# Patient Record
Sex: Female | Born: 1995
Health system: Southern US, Community
[De-identification: ages and names within clinical notes are randomized; demographics above are authoritative.]

---

## 2014-12-02 ENCOUNTER — Encounter (HOSPITAL_COMMUNITY): Payer: Self-pay | Admitting: *Deleted

## 2014-12-02 ENCOUNTER — Emergency Department (HOSPITAL_COMMUNITY)
Admission: EM | Admit: 2014-12-02 | Discharge: 2014-12-02 | Disposition: A | Discharge status: Home / Self Care | Attending: Emergency Medicine | Admitting: Emergency Medicine

## 2014-12-02 ENCOUNTER — Emergency Department (HOSPITAL_COMMUNITY)

## 2014-12-02 DIAGNOSIS — J069 Acute upper respiratory infection, unspecified: Secondary | ICD-10-CM | POA: Diagnosis not present

## 2014-12-02 DIAGNOSIS — Z79899 Other long term (current) drug therapy: Secondary | ICD-10-CM | POA: Insufficient documentation

## 2014-12-02 DIAGNOSIS — R52 Pain, unspecified: Secondary | ICD-10-CM | POA: Diagnosis present

## 2014-12-02 DIAGNOSIS — Z87891 Personal history of nicotine dependence: Secondary | ICD-10-CM | POA: Diagnosis not present

## 2014-12-02 LAB — CBC WITH DIFFERENTIAL/PLATELET
Basophils Absolute: 0 10*3/uL (ref 0.0–0.1)
Basophils Relative: 0 %
EOS ABS: 0.2 10*3/uL (ref 0.0–0.7)
EOS PCT: 5 %
HCT: 40.1 % (ref 36.0–46.0)
Hemoglobin: 13.4 g/dL (ref 12.0–15.0)
LYMPHS ABS: 1.1 10*3/uL (ref 0.7–4.0)
LYMPHS PCT: 36 %
MCH: 31 pg (ref 26.0–34.0)
MCHC: 33.4 g/dL (ref 30.0–36.0)
MCV: 92.8 fL (ref 78.0–100.0)
MONOS PCT: 19 %
Monocytes Absolute: 0.6 10*3/uL (ref 0.1–1.0)
Neutro Abs: 1.2 10*3/uL — ABNORMAL LOW (ref 1.7–7.7)
Neutrophils Relative %: 40 %
PLATELETS: 283 10*3/uL (ref 150–400)
RBC: 4.32 MIL/uL (ref 3.87–5.11)
RDW: 13 % (ref 11.5–15.5)
WBC: 3.1 10*3/uL — AB (ref 4.0–10.5)

## 2014-12-02 LAB — BASIC METABOLIC PANEL
Anion gap: 7 (ref 5–15)
CHLORIDE: 106 mmol/L (ref 101–111)
CO2: 27 mmol/L (ref 22–32)
CREATININE: 0.9 mg/dL (ref 0.44–1.00)
Calcium: 9.1 mg/dL (ref 8.9–10.3)
GFR calc Af Amer: >60 mL/min (ref >60)
GLUCOSE: 84 mg/dL (ref 65–99)
POTASSIUM: 4.1 mmol/L (ref 3.5–5.1)
Sodium: 140 mmol/L (ref 135–145)

## 2014-12-02 LAB — RAPID STREP SCREEN (MED CTR MEBANE ONLY): Streptococcus, Group A Screen (Direct): NEGATIVE

## 2014-12-02 MED ORDER — ACETAMINOPHEN 325 MG PO TABS
ORAL_TABLET | ORAL | Status: AC
  Filled 2014-12-02: qty 2

## 2014-12-02 MED ORDER — ACETAMINOPHEN 325 MG PO TABS
650.0000 mg | ORAL_TABLET | Freq: Once | ORAL | Status: AC | PRN
  Administered 2014-12-02: 650 mg via ORAL

## 2014-12-02 MED ORDER — ACETAMINOPHEN 500 MG PO TABS: 500.0000 mg | ORAL_TABLET | Freq: Four times a day (QID) | ORAL | Status: AC | PRN

## 2014-12-02 NOTE — Discharge Instructions (Signed)
Upper Respiratory Infection, Adult Most upper respiratory infections (URIs) are a viral infection of the air passages leading to the lungs. A URI affects the nose, throat, and upper air passages. The most common type of URI is nasopharyngitis and is typically referred to as "the common cold." URIs run their course and usually go away on their own. Most of the time, a URI does not require medical attention, but sometimes a bacterial infection in the upper airways can follow a viral infection. This is called a secondary infection. Sinus and middle ear infections are common types of secondary upper respiratory infections. Bacterial pneumonia can also complicate a URI. A URI can worsen asthma and chronic obstructive pulmonary disease (COPD). Sometimes, these complications can require emergency medical care and may be life threatening.  CAUSES Almost all URIs are caused by viruses. A virus is a type of germ and can spread from one person to another.  RISKS FACTORS You may be at risk for a URI if:   You smoke.   You have chronic heart or lung disease.  You have a weakened defense (immune) system.   You are very young or very old.   You have nasal allergies or asthma.  You work in crowded or poorly ventilated areas.  You work in health care facilities or schools. SIGNS AND SYMPTOMS  Symptoms typically develop 2-3 days after you come in contact with a cold virus. Most viral URIs last 7-10 days. However, viral URIs from the influenza virus (flu virus) can last 14-18 days and are typically more severe. Symptoms may include:   Runny or stuffy (congested) nose.   Sneezing.   Cough.   Sore throat.   Headache.   Fatigue.   Fever.   Loss of appetite.   Pain in your forehead, behind your eyes, and over your cheekbones (sinus pain).  Muscle aches.  DIAGNOSIS  Your health care provider may diagnose a URI by:  Physical exam.  Tests to check that your symptoms are not due to  another condition such as:  Strep throat.  Sinusitis.  Pneumonia.  Asthma. TREATMENT  A URI goes away on its own with time. It cannot be cured with medicines, but medicines may be prescribed or recommended to relieve symptoms. Medicines may help:  Reduce your fever.  Reduce your cough.  Relieve nasal congestion. HOME CARE INSTRUCTIONS   Take medicines only as directed by your health care provider.   Gargle warm saltwater or take cough drops to comfort your throat as directed by your health care provider.  Use a warm mist humidifier or inhale steam from a shower to increase air moisture. This may make it easier to breathe.  Drink enough fluid to keep your urine clear or pale yellow.   Eat soups and other clear broths and maintain good nutrition.   Rest as needed.   Return to work when your temperature has returned to normal or as your health care provider advises. You may need to stay home longer to avoid infecting others. You can also use a face mask and careful hand washing to prevent spread of the virus.  Increase the usage of your inhaler if you have asthma.   Do not use any tobacco products, including cigarettes, chewing tobacco, or electronic cigarettes. If you need help quitting, ask your health care provider. PREVENTION  The best way to protect yourself from getting a cold is to practice good hygiene.   Avoid oral or hand contact with people with cold   symptoms.   Wash your hands often if contact occurs.  There is no clear evidence that vitamin C, vitamin E, echinacea, or exercise reduces the chance of developing a cold. However, it is always recommended to get plenty of rest, exercise, and practice good nutrition.  SEEK MEDICAL CARE IF:   You are getting worse rather than better.   Your symptoms are not controlled by medicine.   You have chills.  You have worsening shortness of breath.  You have brown or red mucus.  You have yellow or brown nasal  discharge.  You have pain in your face, especially when you bend forward.  You have a fever.  You have swollen neck glands.  You have pain while swallowing.  You have white areas in the back of your throat. SEEK IMMEDIATE MEDICAL CARE IF:   You have severe or persistent:  Headache.  Ear pain.  Sinus pain.  Chest pain.  You have chronic lung disease and any of the following:  Wheezing.  Prolonged cough.  Coughing up blood.  A change in your usual mucus.  You have a stiff neck.  You have changes in your:  Vision.  Hearing.  Thinking.  Mood. MAKE SURE YOU:   Understand these instructions.  Will watch your condition.  Will get help right away if you are not doing well or get worse.   This information is not intended to replace advice given to you by your health care provider. Make sure you discuss any questions you have with your health care provider.   Document Released: 07/08/2000 Document Revised: 05/29/2014 Document Reviewed: 04/19/2013 Elsevier Interactive Patient Education 2016 [employer]  

## 2014-12-02 NOTE — ED Notes (Signed)
Pt c/o generalized body aches & productive cough with white sputum, pt c/o sore throat, nasal congestion, & R ear pain, pt c/o SOB, denies CP, n/v/d, A&O x4

## 2014-12-02 NOTE — ED Provider Notes (Signed)
CSN: [id number]     Arrival date & time 12/02/14  1451 History   First MD Initiated Contact with Patient 12/02/14 1543     Chief Complaint  Patient presents with  . Generalized Body Aches     (Consider location/radiation/quality/duration/timing/severity/associated sxs/prior Treatment) HPI  Karen ButtnerJulia Lawson is a 19 y.o. female with no significant PMH who presents with gradually worsening, persistent generalized body aches.  Patient states that 5 days ago she began experiencing generalized body aches that progressed to sore throat, cough, nasal congestion, intermittent right ear pain, intermittent SOB, headaches, rhinorrhea, and eye pain with movement.  Denies CP, N/V/D, neck stiffness, fevers, visual disturbances, abdominal pain, urinary symptoms, or unilateral leg swelling.  Nothing makes it worse or better.  She has tried Agricultural engineeryQuil and alkaseltzer, which she states helped "only a little bit".  She has been active and played in a volleyball tournament yesterday.  She smokes cigarettes occasionally.  No sick contacts.    History reviewed. No pertinent past medical history. History reviewed. No pertinent past surgical history. No family history on file. Social History  Substance Use Topics  . Smoking status: Former Smoker    Types: Cigarettes    Quit date: 11/27/2014  . Smokeless tobacco: None  . Alcohol Use: 0.6 oz/week    1 Cans of beer per week   OB History    No data available     Review of Systems All other systems negative unless otherwise stated in HPI    Allergies  Review of patient's allergies indicates no known allergies.  Home Medications   Prior to Admission medications   Medication Sig Start Date End Date Taking? Authorizing Provider  Aspirin Effervescent (ALKA-SELTZER PO) Take 1 tablet by mouth once.   Yes Historical Provider, MD  Pseudoeph-Doxylamine-DM-APAP (NYQUIL PO) Take 2 tablets by mouth once.   Yes Historical Provider, MD  Zinc Oxide 10 % OINT Apply 1  application topically 2 (two) times daily. A+D Cream   Yes Historical Provider, MD   BP 120/74 mmHg  Pulse 66  Temp(Src) 98.8 F (37.1 C) (Oral)  Resp 18  Ht 5\' 6"  (1.676 m)  Wt 140 lb (63.504 kg)  BMI 22.61 kg/m2  SpO2 99%  LMP 11/19/2014 Physical Exam  Constitutional: She is oriented to person, place, and time. She appears well-developed and well-nourished.  HENT:  Head: Normocephalic and atraumatic.  Right Ear: Tympanic membrane normal.  Left Ear: Tympanic membrane normal.  Nose: Rhinorrhea present.  Mouth/Throat: Uvula is midline, oropharynx is clear and moist and mucous membranes are normal. No trismus in the jaw. No uvula swelling. No oropharyngeal exudate, posterior oropharyngeal edema or posterior oropharyngeal erythema.  Eyes: Conjunctivae and EOM are normal. Pupils are equal, round, and reactive to light. Right eye exhibits no discharge. Left eye exhibits no discharge. Right conjunctiva is not injected. Right conjunctiva has no hemorrhage. Left conjunctiva is not injected. Left conjunctiva has no hemorrhage.  Neck: Normal range of motion. Neck supple.  Cardiovascular: Normal rate, regular rhythm, normal heart sounds and intact distal pulses.   No murmur heard. Pulses:      Radial pulses are 2+ on the right side, and 2+ on the left side.       Dorsalis pedis pulses are 2+ on the right side, and 2+ on the left side.  Pulmonary/Chest: Effort normal and breath sounds normal. No accessory muscle usage or stridor. No respiratory distress. She has no wheezes. She has no rhonchi. She has no rales.  Abdominal: Soft.  Bowel sounds are normal. She exhibits no distension. There is no tenderness.  Musculoskeletal: Normal range of motion.  Lymphadenopathy:    She has no cervical adenopathy.  Neurological: She is alert and oriented to person, place, and time.  Speech clear without dysarthria.  Skin: Skin is warm and dry.  Psychiatric: She has a normal mood and affect. Her behavior is  normal.    ED Course  Procedures (including critical care time) Labs Review Labs Reviewed  CBC WITH DIFFERENTIAL/PLATELET - Abnormal; Notable for the following:    WBC 3.1 (*)    Neutro Abs 1.2 (*)    All other components within normal limits  BASIC METABOLIC PANEL - Abnormal; Notable for the following:    BUN <5 (*)    All other components within normal limits  RAPID STREP SCREEN (NOT AT [facility])  CULTURE, GROUP A STREP    Imaging Review Dg Chest 2 View  12/02/2014  CLINICAL DATA:  Generalized body aches and productive cough with white sputum. EXAM: CHEST  2 VIEW COMPARISON:  None. FINDINGS: The heart size and mediastinal contours are within normal limits. Both lungs are clear. The visualized skeletal structures are unremarkable. IMPRESSION: No active cardiopulmonary disease. Electronically Signed   By: Signa Kell M.D.   On: 12/02/2014 15:54   I have personally reviewed and evaluated these images and lab results as part of my medical decision-making.   EKG Interpretation None      MDM   Final diagnoses:  URI (upper respiratory infection)    Patient presents with gradually worsening URI sxs.  VSS, NAD.  On exam, heart RRR, lungs CTAB, abdomen soft and benign.  TMs normal bilaterally.  Rhinorrhea present.  Uvula midline, no post oropharyngeal exudates or erythema.  No tonsillar abscess.  Will obtain BMP, CBC, and CXR.  Doubt meningitis.  Evaluate for PNA.  Doubt strep pharyngitis.  Suspect viral syndrome.  CXR shows no active cardiopulmonary disease. Rapid strep negative.  CBC shows WBC 3.1, ANC 1.2.  Follow up PCP. Patient has improved with tylenol.  Case has been discussed with Dr. Clydene Pugh who agrees with the above plan for discharge.      Cheri Fowler, PA-C 12/02/14 1802  Lyndal Pulley, MD 12/07/14 [phone]

## 2014-12-05 LAB — CULTURE, GROUP A STREP

## 2015-09-03 ENCOUNTER — Emergency Department (HOSPITAL_COMMUNITY)
Admission: EM | Admit: 2015-09-03 | Discharge: 2015-09-03 | Disposition: A | Discharge status: Home / Self Care | Attending: Emergency Medicine | Admitting: Emergency Medicine

## 2015-09-03 ENCOUNTER — Encounter (HOSPITAL_COMMUNITY): Payer: Self-pay | Admitting: *Deleted

## 2015-09-03 DIAGNOSIS — Y939 Activity, unspecified: Secondary | ICD-10-CM | POA: Insufficient documentation

## 2015-09-03 DIAGNOSIS — Z87891 Personal history of nicotine dependence: Secondary | ICD-10-CM | POA: Diagnosis not present

## 2015-09-03 DIAGNOSIS — Z7982 Long term (current) use of aspirin: Secondary | ICD-10-CM | POA: Diagnosis not present

## 2015-09-03 DIAGNOSIS — S39012A Strain of muscle, fascia and tendon of lower back, initial encounter: Secondary | ICD-10-CM | POA: Insufficient documentation

## 2015-09-03 DIAGNOSIS — Y999 Unspecified external cause status: Secondary | ICD-10-CM | POA: Diagnosis not present

## 2015-09-03 DIAGNOSIS — Y92410 Unspecified street and highway as the place of occurrence of the external cause: Secondary | ICD-10-CM | POA: Diagnosis not present

## 2015-09-03 DIAGNOSIS — S3992XA Unspecified injury of lower back, initial encounter: Secondary | ICD-10-CM | POA: Diagnosis present

## 2015-09-03 MED ORDER — CYCLOBENZAPRINE HCL 10 MG PO TABS: 5.0000 mg | ORAL_TABLET | Freq: Three times a day (TID) | ORAL | 0 refills | Status: AC | PRN

## 2015-09-03 MED ORDER — DICLOFENAC SODIUM 50 MG PO TBEC: 50.0000 mg | DELAYED_RELEASE_TABLET | Freq: Two times a day (BID) | ORAL | 0 refills | Status: AC
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
## 2015-09-03 NOTE — Discharge Instructions (Signed)
Do not take the muscle relaxant if driving as it will make you sleepy.  Follow up with Dr. Eulah PontMurphy if symptoms worsen.
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
## 2015-11-09 ENCOUNTER — Encounter (HOSPITAL_COMMUNITY): Payer: Self-pay

## 2015-11-09 ENCOUNTER — Emergency Department (HOSPITAL_COMMUNITY)

## 2015-11-09 ENCOUNTER — Emergency Department (HOSPITAL_COMMUNITY)
Admission: EM | Admit: 2015-11-09 | Discharge: 2015-11-09 | Disposition: A | Discharge status: Home / Self Care | Attending: Emergency Medicine | Admitting: Emergency Medicine

## 2015-11-09 DIAGNOSIS — Z7982 Long term (current) use of aspirin: Secondary | ICD-10-CM | POA: Diagnosis not present

## 2015-11-09 DIAGNOSIS — S99912A Unspecified injury of left ankle, initial encounter: Secondary | ICD-10-CM | POA: Diagnosis present

## 2015-11-09 DIAGNOSIS — Y9368 Activity, volleyball (beach) (court): Secondary | ICD-10-CM | POA: Diagnosis not present

## 2015-11-09 DIAGNOSIS — Y999 Unspecified external cause status: Secondary | ICD-10-CM | POA: Insufficient documentation

## 2015-11-09 DIAGNOSIS — S93402A Sprain of unspecified ligament of left ankle, initial encounter: Secondary | ICD-10-CM

## 2015-11-09 DIAGNOSIS — Z87891 Personal history of nicotine dependence: Secondary | ICD-10-CM | POA: Diagnosis not present

## 2015-11-09 DIAGNOSIS — Y929 Unspecified place or not applicable: Secondary | ICD-10-CM | POA: Diagnosis not present

## 2015-11-09 DIAGNOSIS — X509XXA Other and unspecified overexertion or strenuous movements or postures, initial encounter: Secondary | ICD-10-CM | POA: Insufficient documentation

## 2015-11-09 MED ORDER — NAPROXEN 250 MG PO TABS: 250.0000 mg | ORAL_TABLET | Freq: Two times a day (BID) | ORAL | 0 refills | Status: AC

## 2015-11-09 NOTE — ED Provider Notes (Signed)
[facility]-EMER[facility]Y DEPT Provider Note   CSN: [id number] Arrival date & time: 11/09/15  2056  By signing my name below, I, Karen Lawson, attest that this documentation has been prepared under the direction and in the presence of non-physician practitioner, Everlene FarrierWilliam Helaina Stefano, PA-C. Electronically Signed: Freida Busmaniana Lawson, Scribe. 11/09/2015. 10:17 PM.  History   Chief Complaint Chief Complaint  Patient presents with  . Ankle Injury    The history is provided by the patient. No language interpreter was used.     HPI Comments:  Karen Lawson is a 20 y.o. female who presents to the Emergency Department complaining of constant, moderate left ankle pain s/p injury today. Pt states she jumped up while playing volleyball and the ankle went inwards when she landed. No alleviating factors noted.  She denies numbness, tingling, weakness or other injury. Pt has no other complaints or injuries at this time.     History reviewed. No pertinent past medical history.  There are no active problems to display for this patient.   History reviewed. No pertinent surgical history.  OB History    No data available       Home Medications    Prior to Admission medications   Medication Sig Start Date End Date Taking? Authorizing Provider  acetaminophen (TYLENOL) 500 MG tablet Take 1 tablet (500 mg total) by mouth every 6 (six) hours as needed. 12/02/14   Cheri FowlerKayla Rose, PA-C  Aspirin Effervescent (ALKA-SELTZER PO) Take 1 tablet by mouth once.    Historical Provider, MD  cyclobenzaprine (FLEXERIL) 10 MG tablet Take 0.5 tablets (5 mg total) by mouth 3 (three) times daily as needed for muscle spasms. 09/03/15   Hope Orlene OchM Neese, NP  diclofenac (VOLTAREN) 50 MG EC tablet Take 1 tablet (50 mg total) by mouth 2 (two) times daily. 09/03/15   Hope Orlene OchM Neese, NP  naproxen (NAPROSYN) 250 MG tablet Take 1 tablet (250 mg total) by mouth 2 (two) times daily with a meal. 11/09/15   Everlene FarrierWilliam Atif Chapple, PA-C  Pseudoeph-Doxylamine-DM-APAP  (NYQUIL PO) Take 2 tablets by mouth once.    Historical Provider, MD  Zinc Oxide 10 % OINT Apply 1 application topically 2 (two) times daily. A+D Cream    Historical Provider, MD    Family History History reviewed. No pertinent family history.  Social History Social History  Substance Use Topics  . Smoking status: Former Smoker    Types: Cigarettes    Quit date: 11/27/2014  . Smokeless tobacco: Never Used  . Alcohol use 0.6 oz/week    1 Cans of beer per week     Allergies   Review of patient's allergies indicates no known allergies.   Review of Systems Review of Systems  Constitutional: Negative for fever.  Musculoskeletal: Positive for arthralgias and myalgias.  Skin: Negative for color change, rash and wound.  Neurological: Negative for weakness and numbness.     Physical Exam Updated Vital Signs BP 112/77 (BP Location: Left Arm)   Pulse 92   Resp 16   SpO2 100%   Physical Exam  Constitutional: She appears well-developed and well-nourished. No distress.  HENT:  Head: Normocephalic and atraumatic.  Eyes: Right eye exhibits no discharge. Left eye exhibits no discharge.  Cardiovascular: Normal rate, regular rhythm and intact distal pulses.   Bilateral DP/PT pulses intact  Pulmonary/Chest: Effort normal. No respiratory distress.  Musculoskeletal: Normal range of motion. She exhibits edema and tenderness. She exhibits no deformity.  Mild tenderness diffusely to her left ankle. No focal tenderness. No  ankle laxity noted. Mild soft tissue swelling. No calf edema or tenderness. No tenderness to her left knee. No ankle ecchymosis or warmth.  Neurological: She is alert. Coordination normal.  Sensation is intact in her bilateral distal toes.  Skin: Skin is warm and dry. Capillary refill takes less than 2 seconds. No rash noted. She is not diaphoretic. No erythema. No pallor.  Psychiatric: She has a normal mood and affect. Her behavior is normal.  Nursing note and vitals  reviewed.    ED Treatments / Results  DIAGNOSTIC STUDIES:  Oxygen Saturation is 100% on RA, normal by my interpretation.    COORDINATION OF CARE:  10:17 PM Discussed treatment plan with pt at bedside and pt agreed to plan.  Labs (all labs ordered are listed, but only abnormal results are displayed) Labs Reviewed - No data to display  EKG  EKG Interpretation None       Radiology Dg Ankle Complete Left  Result Date: 11/09/2015 CLINICAL DATA:  Left ankle pain after twisting injury playing volleyball today. Swelling. EXAM: LEFT ANKLE COMPLETE - 3+ VIEW COMPARISON:  None. FINDINGS: There is no evidence of fracture, dislocation, or joint effusion. There is no evidence of arthropathy or other focal bone abnormality. There is soft tissue edema. IMPRESSION: Soft tissue edema without osseous abnormality. Electronically Signed   By: Rubye Oaks M.D.   On: 11/09/2015 21:40    Procedures Procedures (including critical care time)  Medications Ordered in ED Medications - No data to display   Initial Impression / Assessment and Plan / ED Course  I have reviewed the triage vital signs and the nursing notes.  Pertinent labs & imaging results that were available during my care of the patient were reviewed by me and considered in my medical decision making (see chart for details).  Clinical Course    Patient presented with left ankle pain after injuring it while at volleyball practice today. She is neurovascularly intact. Patient X-Ray negative for obvious fracture or dislocation.  Pt advised to follow up with sports medicine.. Patient given ASO brace and crutches while in ED, conservative therapy recommended and discussed. Patient will be discharged home & is agreeable with above plan. Returns precautions discussed. Pt appears safe for discharge.   Final Clinical Impressions(s) / ED Diagnoses   Final diagnoses:  Sprain of left ankle, unspecified ligament, initial encounter     New Prescriptions New Prescriptions   NAPROXEN (NAPROSYN) 250 MG TABLET    Take 1 tablet (250 mg total) by mouth 2 (two) times daily with a meal.   I personally performed the services described in this documentation, which was scribed in my presence. The recorded information has been reviewed and is accurate.      Everlene Farrier, PA-C 11/09/15 2225    Lavera Guise, MD 11/10/15 [phone]

## 2015-11-09 NOTE — ED Triage Notes (Signed)
Onset 3-4 hours PTA pt was playing volleyball and landed wrong on left ankle.   Pt taped ankle.  Unable to bear weight.

## 2015-11-09 NOTE — ED Notes (Signed)
Pt given a ice pack for her lt ankle and a warm blanket.

## 2015-11-09 NOTE — ED Notes (Signed)
Patient transported to X-ray 

## 2015-11-09 NOTE — ED Notes (Signed)
Pt understood dc material. NAD noted. Scripts given at dc 

## 2016-10-23 IMAGING — CR DG ANKLE COMPLETE 3+V*L*
3 series · 3 of 3 positions shown · non-contrast
Comparison: None.

CLINICAL DATA: Left ankle pain after twisting injury playing
volleyball today. Swelling.

EXAM:
LEFT ANKLE COMPLETE - 3+ VIEW

[ankle ap]
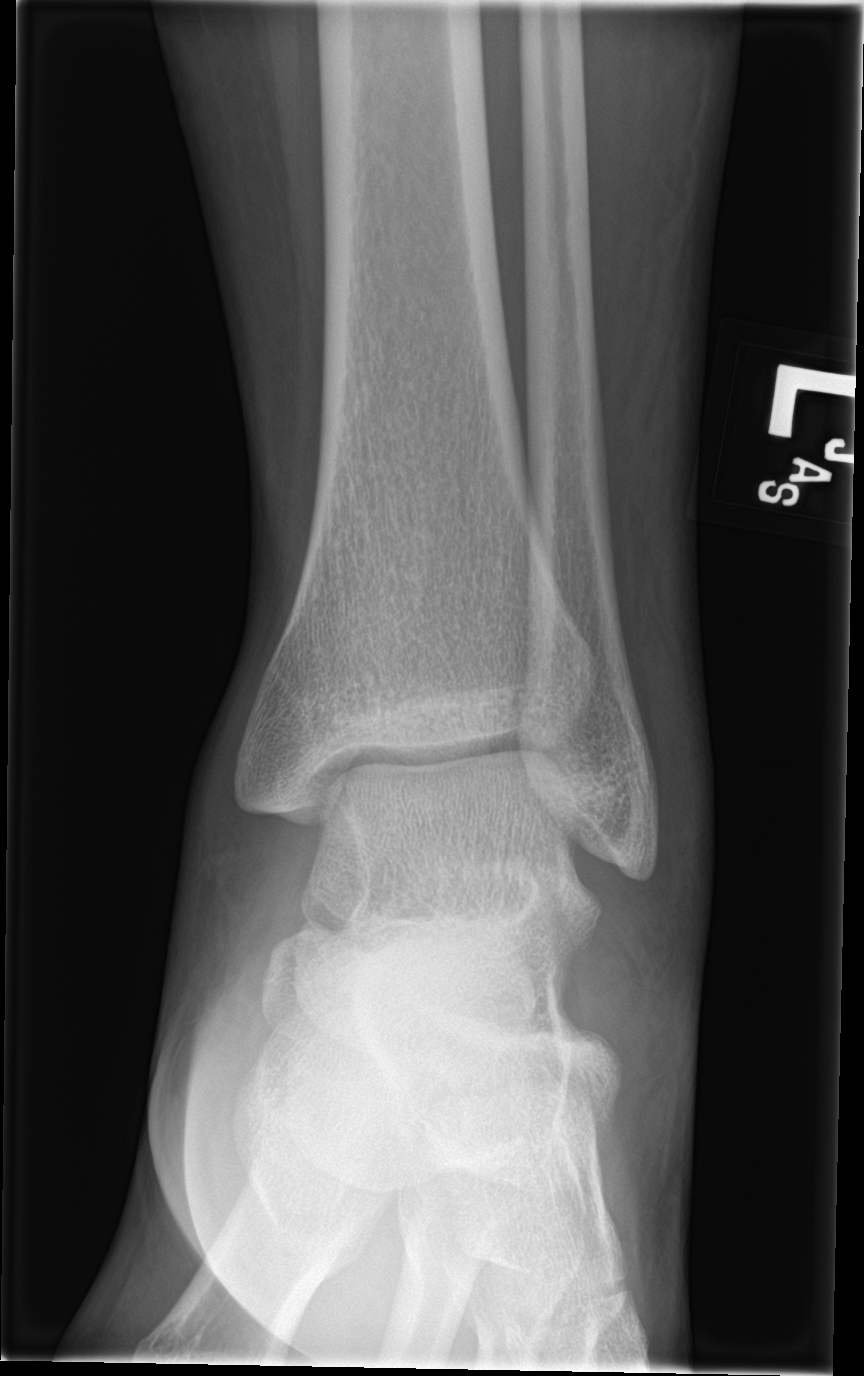

[ankle obl]
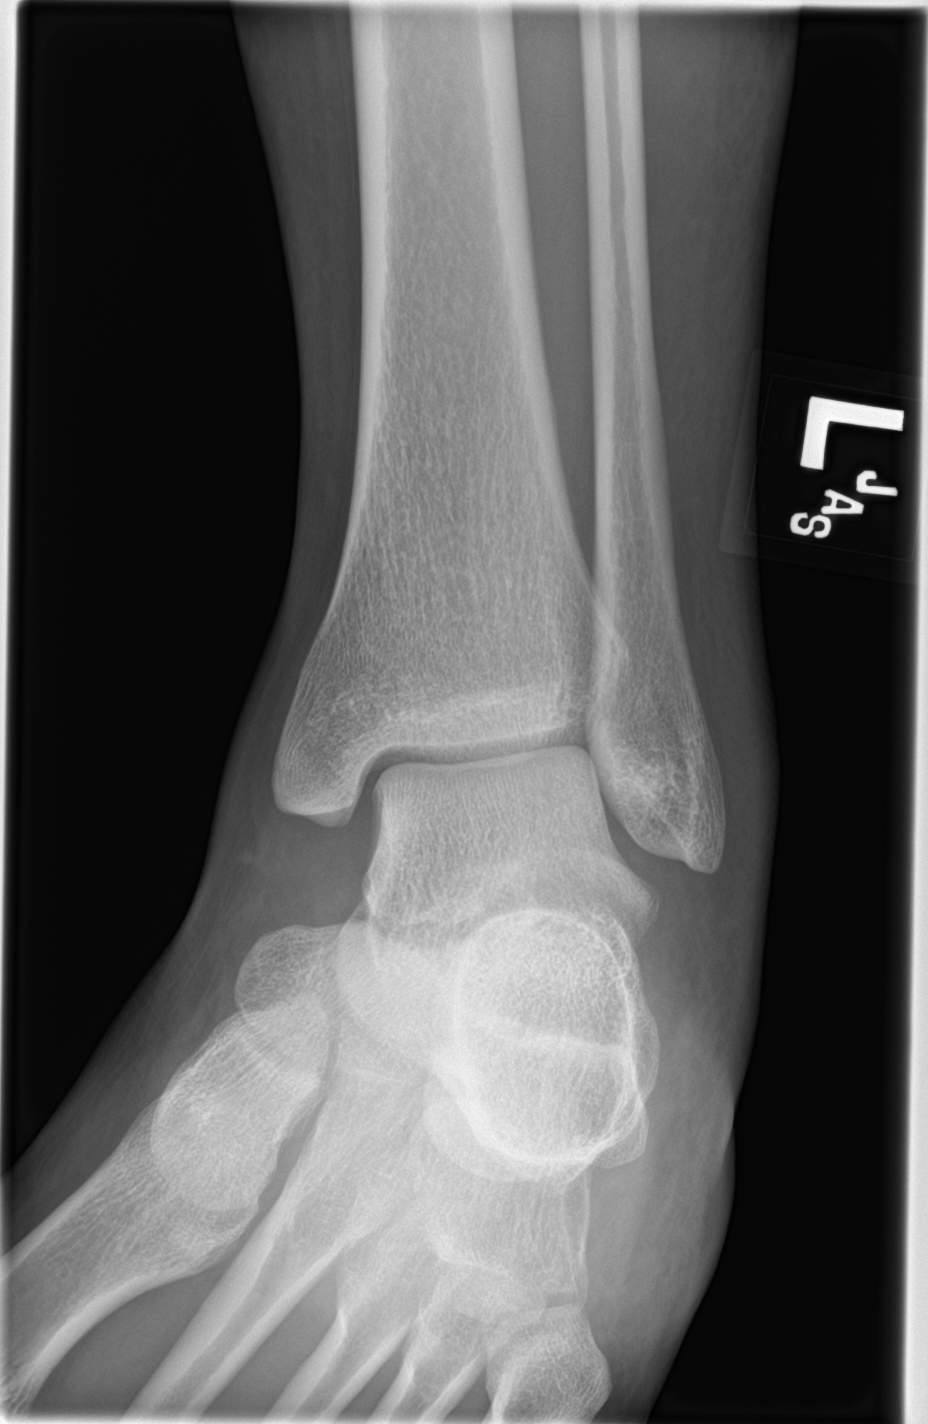

[ankle lat]
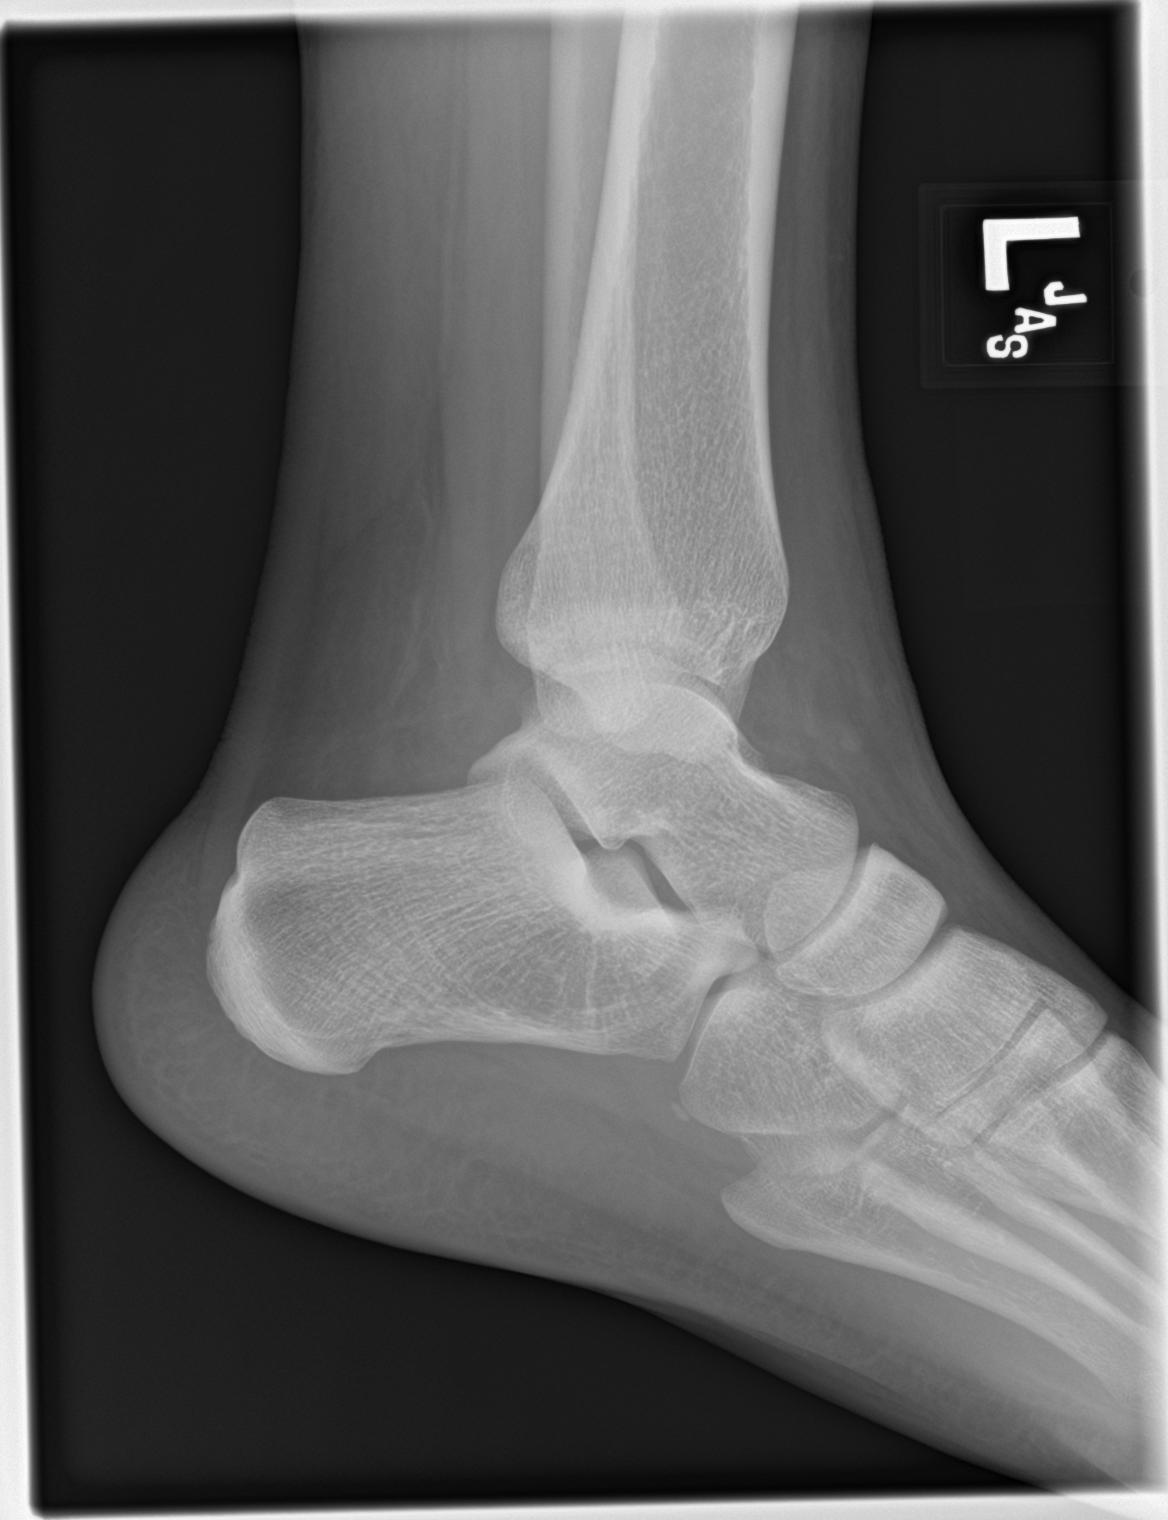

[3 of 3 positions shown; findings below may reference images not displayed]

FINDINGS: There is no evidence of fracture, dislocation, or joint effusion.
There is no evidence of arthropathy or other focal bone abnormality.
There is soft tissue edema.
IMPRESSION: Soft tissue edema without osseous abnormality.

## 2019-09-19 ENCOUNTER — Other Ambulatory Visit: Payer: Self-pay
